# Patient Record
Sex: Male | Born: 1985 | Race: Black or African American | Marital: Single
Health system: Southern US, Community
[De-identification: ages and names within clinical notes are randomized; demographics above are authoritative.]

---

## 2006-11-25 ENCOUNTER — Emergency Department: Payer: Self-pay | Admitting: Emergency Medicine

## 2008-05-31 ENCOUNTER — Ambulatory Visit: Payer: Self-pay

## 2009-01-11 ENCOUNTER — Emergency Department: Payer: Self-pay | Admitting: Emergency Medicine

## 2010-02-15 IMAGING — CR DG CHEST 2V
1 series · 2 of 2 positions shown · non-contrast
Comparison: none

REASON FOR EXAM: +PPD, FAX DR> LATA FAX [phone]
COMMENTS:

[Series 1: view not recorded · 0.17mm/px · 2 of 2 slices shown]
[im 1/2]
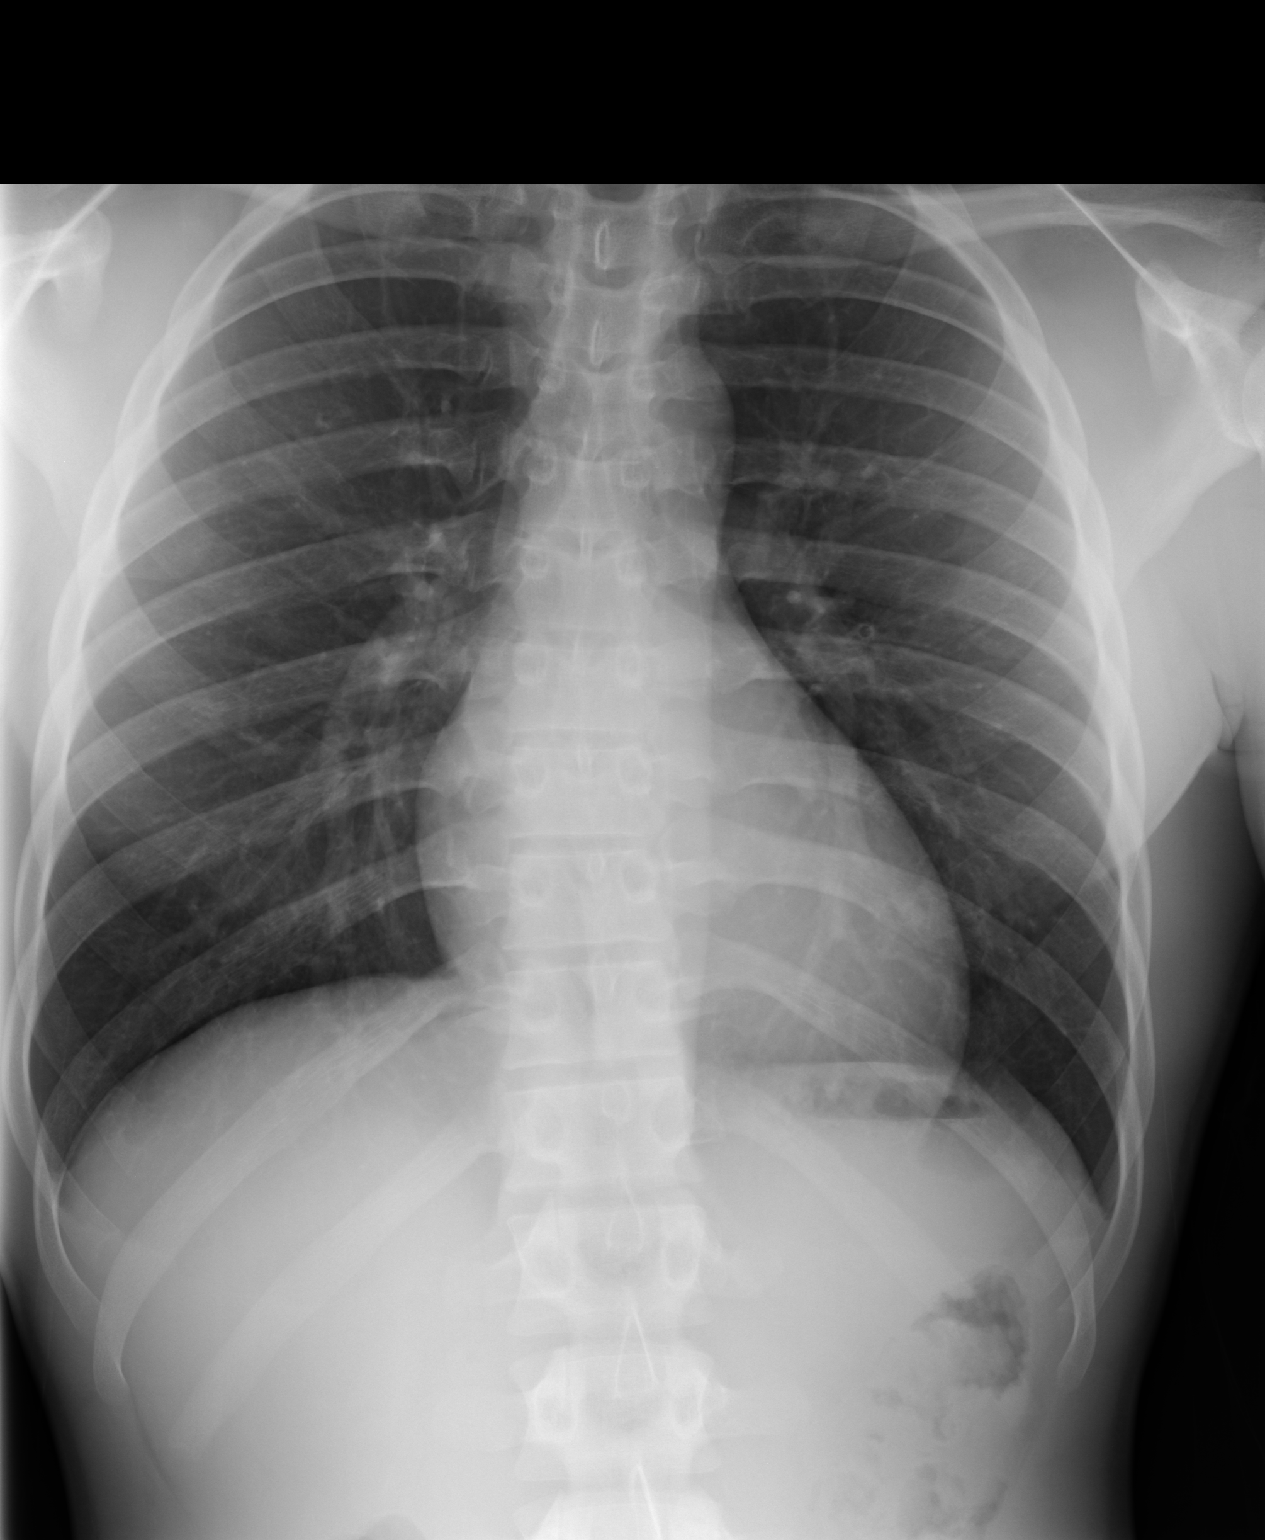
[im 2/2]
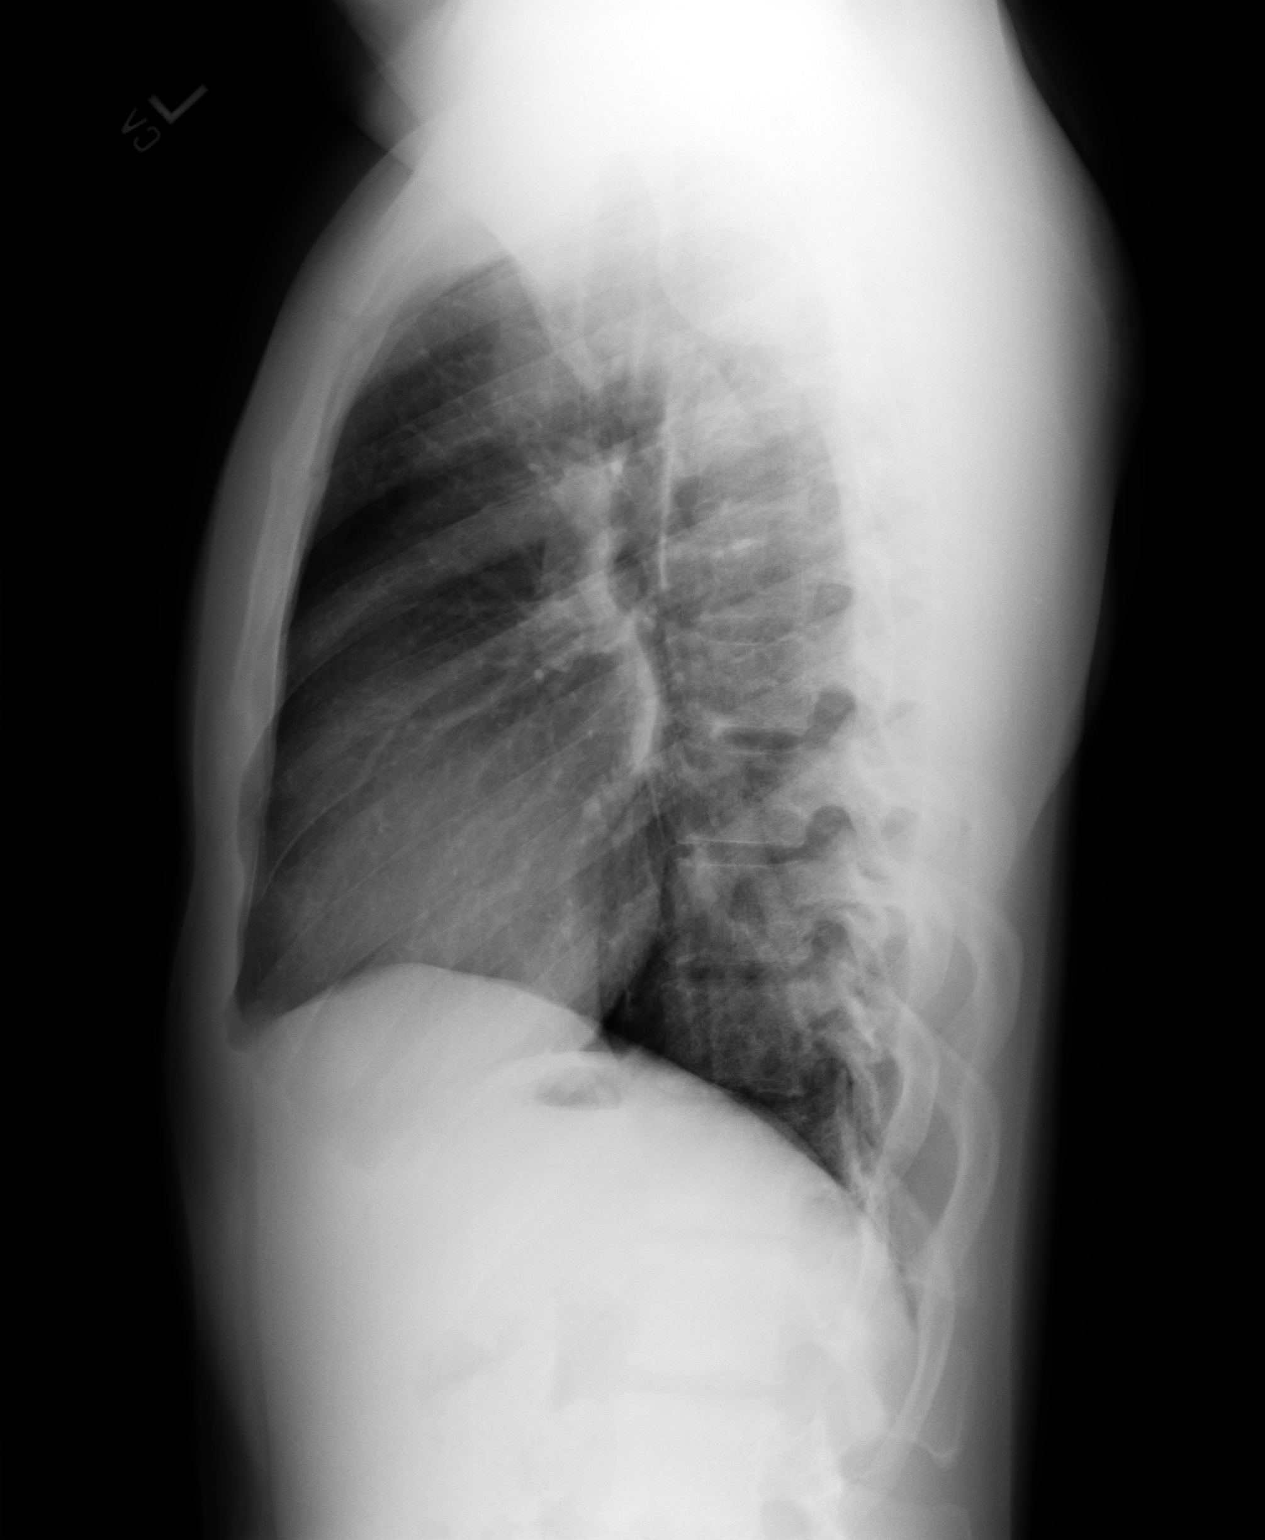

[2 of 2 positions shown; findings below may reference images not displayed]

PROCEDURE:     DXR - DXR CHEST PA (OR AP) AND LATERAL  - May 31, 2008 [DATE]

RESULT:     PA and lateral views of the chest were obtained. The lung fields
are clear. No pneumonia, pneumothorax or pleural effusion is seen.
Attention to the lung apices shows no infiltrate or cystic change suspicious
for tuberculosis. Heart size is normal. The mediastinal and osseous
structures show no significant abnormalities.
IMPRESSION: 1.     No significant abnormalities are noted.

## 2010-04-24 ENCOUNTER — Emergency Department: Payer: Self-pay | Admitting: Emergency Medicine

## 2012-04-15 ENCOUNTER — Emergency Department: Payer: Self-pay | Admitting: Emergency Medicine

## 2012-04-15 LAB — ETHANOL
Ethanol %: 0.274 % — ABNORMAL HIGH (ref 0.000–0.080)
Ethanol: 274 mg/dL

## 2015-12-07 ENCOUNTER — Emergency Department: Payer: Self-pay

## 2015-12-07 ENCOUNTER — Emergency Department
Admission: EM | Admit: 2015-12-07 | Discharge: 2015-12-07 | Disposition: A | Discharge status: Home / Self Care | Payer: Self-pay | Attending: Emergency Medicine | Admitting: Emergency Medicine

## 2015-12-07 DIAGNOSIS — F1010 Alcohol abuse, uncomplicated: Secondary | ICD-10-CM | POA: Insufficient documentation

## 2015-12-07 DIAGNOSIS — S7011XA Contusion of right thigh, initial encounter: Secondary | ICD-10-CM | POA: Insufficient documentation

## 2015-12-07 DIAGNOSIS — W2209XA Striking against other stationary object, initial encounter: Secondary | ICD-10-CM | POA: Insufficient documentation

## 2015-12-07 DIAGNOSIS — Y9389 Activity, other specified: Secondary | ICD-10-CM | POA: Insufficient documentation

## 2015-12-07 DIAGNOSIS — Z23 Encounter for immunization: Secondary | ICD-10-CM | POA: Insufficient documentation

## 2015-12-07 DIAGNOSIS — Y998 Other external cause status: Secondary | ICD-10-CM | POA: Insufficient documentation

## 2015-12-07 DIAGNOSIS — S0003XA Contusion of scalp, initial encounter: Secondary | ICD-10-CM

## 2015-12-07 DIAGNOSIS — S0101XA Laceration without foreign body of scalp, initial encounter: Secondary | ICD-10-CM | POA: Insufficient documentation

## 2015-12-07 DIAGNOSIS — Y9289 Other specified places as the place of occurrence of the external cause: Secondary | ICD-10-CM | POA: Insufficient documentation

## 2015-12-07 MED ORDER — TETANUS-DIPHTH-ACELL PERTUSSIS 5-2.5-18.5 LF-MCG/0.5 IM SUSP
0.5000 mL | Freq: Once | INTRAMUSCULAR | Status: AC
  Administered 2015-12-07: 0.5 mL via INTRAMUSCULAR
  Filled 2015-12-07: qty 0.5

## 2015-12-07 MED ORDER — LIDOCAINE HCL (PF) 1 % IJ SOLN
5.0000 mL | Freq: Once | INTRAMUSCULAR | Status: DC
  Filled 2015-12-07: qty 5

## 2015-12-07 NOTE — Discharge Instructions (Signed)
May take Tylenol as needed for pain. Do not drink any alcohol the remainder of the day. Clean area daily as directed below. Return to the emergency room if any signs of infection.   WOUND CARE Please return in 7 days to have your stitches/staples removed or sooner if you have concerns.  Keep area clean and dry for 24 hours. Do not remove bandage, if applied.  After 24 hours, remove bandage and wash wound gently with mild soap and warm water. Reapply a new bandage after cleaning wound, if directed.  Continue daily cleansing with soap and water until stitches/staples are removed.  Do not apply any ointments or creams to the wound while stitches/staples are in place, as this may cause delayed healing.  Notify the office if you experience any of the following signs of infection: Swelling, redness, pus drainage, streaking, fever >101.0 F  Notify the office if you experience excessive bleeding that does not stop after 15-20 minutes of constant, firm pressure.

## 2015-12-07 NOTE — ED Notes (Signed)
Pt reports he was chasing his dog when he fell and hit his head on the step. Pt has approx 1 inch lac to the left side of his head with bleeding controlled at this time. Pt also co pain to the muscle in the left thigh on the back.

## 2015-12-07 NOTE — ED Provider Notes (Signed)
[facility] Emergency Department Provider Note  ____________________________________________  Time seen: Approximately 7:48 AM  I have reviewed the triage vital signs and the nursing notes.   HISTORY  Chief Complaint Head Laceration  HPI Jack Sanchez is a 30 y.o. male is here with laceration to the left side of his head. Patient states he was chasing his dog when he felt his head on a step. He denies any loss of consciousness during this injury. Patient also complains of left thigh pain. This accident occurred at approximately 3 AM. Patient admits to drinking approximately 6-7 beers this morning during an prior to his fall from chasing his dog. He denies any dizziness, blurred vision, or headache.Currently he rates his pain as a 7 out of 10. He is uncertain when last time he had a tetanus booster.   No past medical history on file.  There are no active problems to display for this patient.   No past surgical history on file.  No current outpatient prescriptions on file.  Allergies Review of patient's allergies indicates no known allergies.  No family history on file.  Social History Social History  Substance Use Topics  . Smoking status: Not on file  . Smokeless tobacco: Not on file  . Alcohol Use: Not on file    Review of Systems Constitutional: No fever/chills Eyes: Some blurred vision. ENT: No trauma Cardiovascular: Denies chest pain. Respiratory: Denies shortness of breath. Gastrointestinal: No abdominal pain.  No nausea, no vomiting.   Musculoskeletal: Negative for back pain. Skin: Positive for laceration Neurological: Negative for headaches, focal weakness or numbness.  10-point ROS otherwise negative.  ____________________________________________   PHYSICAL EXAM:  VITAL SIGNS: ED Triage Vitals  Enc Vitals Group     BP 12/07/15 0614 143/74 mmHg     Pulse Rate 12/07/15 0614 111     Resp 12/07/15 0614 18     Temp 12/07/15  0614 98.7 F (37.1 C)     Temp Source 12/07/15 0614 Oral     SpO2 12/07/15 0614 95 %     Weight 12/07/15 0614 200 lb (90.719 kg)     Height 12/07/15 0614 6\' 2"  (1.88 m)     Head Cir --      Peak Flow --      Pain Score 12/07/15 0615 7     Pain Loc --      Pain Edu? --      Excl. in GC? --     Constitutional: Alert and oriented. Well appearing and in no acute distress. Strong odor of EtOH present. Eyes: Conjunctivae are normal. PERRL. EOMI. Head: Atraumatic. Nose: No congestion/rhinnorhea. Neck: No stridor. No cervical spine tenderness on palpation posteriorly in range of motion is within normal limits without any difficulty. Cardiovascular: Normal rate, regular rhythm. Grossly normal heart sounds.  Good peripheral circulation. Respiratory: Normal respiratory effort.  No retractions. Lungs CTAB. Gastrointestinal: Soft and nontender. No distention.  Musculoskeletal: Left femur no visible signs of deformity or soft tissue edema present. No ecchymosis is noted and no abrasions were seen. Neurologic:  Normal speech and language. No gross focal neurologic deficits are appreciated. No gait instability. Skin:  Skin is warm, dry and intact. Left lateral scalp there is a irregular 2.5 cm laceration without active bleeding. There is no foreign body noted. Psychiatric: Mood and affect are normal. Speech and behavior are normal.  ____________________________________________   LABS (all labs ordered are listed, but only abnormal results are displayed)  Labs Reviewed -  No data to display  RADIOLOGY  Right femur no fracture or dislocation per radiologist. CT scan per radiologist shows no skull fracture, no masses are intracranial hemorrhage. ____________________________________________   PROCEDURES  Procedure(s) performed: LACERATION REPAIR Performed by: Tommi Rumpshonda L Summers Authorized by: Tommi Rumpshonda L Summers Consent: Verbal consent obtained. Risks and benefits: risks, benefits and  alternatives were discussed Consent given by: patient Patient identity confirmed: provided demographic data Prepped and Draped in normal sterile fashion Wound explored  Laceration Location: left lateral scalp   Laceration Length: 2.5 cm  No Foreign Bodies seen or palpated  Anesthesia: local infiltration  Local anesthetic: lidocaine 1% without epinephrine  Anesthetic total: 2.0 ml  Irrigation method: syringe Amount of cleaning: standard  Skin closure: Staples  Number of sutures: 4  Technique: individual  Patient tolerance: Patient tolerated the procedure well with no immediate complications.  Critical Care performed: No  ____________________________________________   INITIAL IMPRESSION / ASSESSMENT AND PLAN / ED COURSE  Pertinent labs & imaging results that were available during my care of the patient were reviewed by me and considered in my medical decision making (see chart for details).  Patient is to return to the emergency room for staple removal. He is given information on keeping clean and dry and to watch for signs of infection. He is taken over the counter Tylenol if needed for pain. ____________________________________________   FINAL CLINICAL IMPRESSION(S) / ED DIAGNOSES  Final diagnoses:  Laceration of scalp without foreign body, initial encounter  Contusion of scalp, initial encounter  Contusion of right thigh, initial encounter      Tommi RumpsRhonda L Summers, PA-C 12/07/15 1620  Emily FilbertJonathan E Williams, MD 12/08/15 [phone]

## 2015-12-16 ENCOUNTER — Emergency Department
Admission: EM | Admit: 2015-12-16 | Discharge: 2015-12-16 | Disposition: A | Discharge status: Home / Self Care | Payer: Self-pay | Attending: Emergency Medicine | Admitting: Emergency Medicine

## 2015-12-16 ENCOUNTER — Encounter: Payer: Self-pay | Admitting: Emergency Medicine

## 2015-12-16 DIAGNOSIS — Z4801 Encounter for change or removal of surgical wound dressing: Secondary | ICD-10-CM | POA: Insufficient documentation

## 2015-12-16 DIAGNOSIS — Z4802 Encounter for removal of sutures: Secondary | ICD-10-CM

## 2015-12-16 NOTE — ED Provider Notes (Signed)
[facility] Emergency Department Provider Note   ____________________________________________  Time seen: 2:24 PM  I have reviewed the triage vital signs and the nursing notes.   HISTORY  Chief Complaint Suture / Staple Removal      HPI Jack Sanchez is a 30 y.o. male who presents to the emergency department for staple removal.Staples were inserted to here on 12/07/2015. He denies complaints.   History reviewed. No pertinent past medical history.  There are no active problems to display for this patient.   History reviewed. No pertinent past surgical history.  No current outpatient prescriptions on file.  Allergies Review of patient's allergies indicates no known allergies.  No family history on file.  Social History Social History  Substance Use Topics  . Smoking status: None  . Smokeless tobacco: None  . Alcohol Use: None    Review of Systems  Constitutional: Denies fever.  HEENT: No change from baseline Respiratory: No cough or shortness of breath Musculoskeletal: No pain. Skin: healing wound; pain gradually resolving.  ____________________________________________   PHYSICAL EXAM:  VITAL SIGNS: ED Triage Vitals  Enc Vitals Group     BP 12/16/15 1425 126/73 mmHg     Pulse Rate 12/16/15 1425 89     Resp 12/16/15 1425 18     Temp 12/16/15 1425 98.3 F (36.8 C)     Temp src --      SpO2 12/16/15 1425 97 %     Weight --      Height --      Head Cir --      Peak Flow --      Pain Score --      Pain Loc --      Pain Edu? --      Excl. in GC? --       Constitutional: Appears well. No distress HEENT: Atraumtaic, normal appearance, EOMI, sclera normal, voice normal. Respiratory: Respirations even and unlabored.  Cardiovascular: Capillary refill normal. Peripheral pulses 2+ Musculoskeletal: Full ROM x 4. Skin: Well-healed laceration with 4 staples to the left parietal area. No evidence of infection or  cellulitis. Neurovascular: Gait steady; Alert and oriented x 4.   PROCEDURES  Procedure(s) performed: SUTURE REMOVAL Performed by: RN  Consent: Verbal consent obtained. Patient identity confirmed: provided demographic data Time out: Immediately prior to procedure a "time out" was called to verify the correct patient, procedure, equipment, support staff and site/side marked as required.  Location details: Parietal area  Wound Appearance: clean  Sutures/Staples Removed: 4  Facility: sutures placed in this facility Patient tolerance: Patient tolerated the procedure well with no immediate complications.    ____________________________________________   INITIAL IMPRESSION / ASSESSMENT AND PLAN / ED COURSE  Pertinent labs & imaging results that were available during my care of the patient were reviewed by me and considered in my medical decision making (see chart for details).  Wound care discussed. Patient advised to keep covered with sunscreen. Patient was advised to return to the ER for symptoms that change or worsen if unable to schedule an appointment with primary care.  ____________________________________________   FINAL CLINICAL IMPRESSION(S) / ED DIAGNOSES  Final diagnoses:  Encounter for staple removal      Chinita Pester, FNP 12/16/15 1530  Sharman Cheek, MD 12/17/15 2118

## 2015-12-16 NOTE — ED Notes (Signed)
4 staples removed from left side head without difficulty. No bleeding noted.

## 2015-12-16 NOTE — Discharge Instructions (Signed)

## 2015-12-16 NOTE — ED Notes (Signed)
Pt presents to have staples removed. Four staples intact to left side of head. No signs/symptoms of infection.

## 2018-07-29 ENCOUNTER — Emergency Department
Admission: EM | Admit: 2018-07-29 | Discharge: 2018-07-29 | Disposition: A | Discharge status: Home / Self Care | Payer: Self-pay | Attending: Emergency Medicine | Admitting: Emergency Medicine

## 2018-07-29 ENCOUNTER — Emergency Department: Payer: Self-pay

## 2018-07-29 ENCOUNTER — Other Ambulatory Visit: Payer: Self-pay

## 2018-07-29 DIAGNOSIS — G540 Brachial plexus disorders: Secondary | ICD-10-CM | POA: Insufficient documentation

## 2018-07-29 DIAGNOSIS — F17200 Nicotine dependence, unspecified, uncomplicated: Secondary | ICD-10-CM | POA: Insufficient documentation

## 2018-07-29 DIAGNOSIS — M25512 Pain in left shoulder: Secondary | ICD-10-CM | POA: Insufficient documentation

## 2018-07-29 MED ORDER — IBUPROFEN 600 MG PO TABS: 600.0000 mg | ORAL_TABLET | Freq: Three times a day (TID) | ORAL | 0 refills | Status: DC | PRN

## 2018-07-29 MED ORDER — IBUPROFEN 600 MG PO TABS
600.0000 mg | ORAL_TABLET | Freq: Once | ORAL | Status: AC
  Administered 2018-07-29: 600 mg via ORAL
  Filled 2018-07-29: qty 1

## 2018-07-29 NOTE — Discharge Instructions (Addendum)
Please take your pain medication as prescribed 3 times a day and most critically make an appointment to establish care with a primary care physician.  It is possible that you have thoracic outlet syndrome which needs to be fully worked up and evaluated.  Return to the emergency department for any concerns.  It was a pleasure to take care of you today, and thank you for coming to our emergency department.  If you have any questions or concerns before leaving please ask the nurse to grab me and I'm more than happy to go through your aftercare instructions again.  If you were prescribed any opioid pain medication today such as Norco, Vicodin, Percocet, morphine, hydrocodone, or oxycodone please make sure you do not drive when you are taking this medication as it can alter your ability to drive safely.  If you have any concerns once you are home that you are not improving or are in fact getting worse before you can make it to your follow-up appointment, please do not hesitate to call 911 and come back for further evaluation.  Merrily Brittle, MD

## 2018-07-29 NOTE — ED Provider Notes (Signed)
[facility] Emergency Department Provider Note  ____________________________________________   First MD Initiated Contact with Patient 07/29/18 0407     (approximate)  I have reviewed the triage vital signs and the nursing notes.   HISTORY  Chief Complaint Arm Pain   HPI Jack Sanchez is a 32 y.o. male comes to the emergency department with 2 to 3 weeks of atraumatic left neck and left shoulder pain.  The patient's wife is currently at labor and delivery and scheduled to deliver a baby today or tomorrow and the patient noted left shoulder pain and nursing upstairs advised him to come to the emergency department for evaluation.  He is right-hand dominant.  He does report numbness and tingling in his left hand particularly when raising his arm above his head.  He is a cigarette smoker.  He has had increasing cough recently.  No weakness in his arm.  No change in color in his arm.    History reviewed. No pertinent past medical history.  There are no active problems to display for this patient.   History reviewed. No pertinent surgical history.  Prior to Admission medications   Medication Sig Start Date End Date Taking? Authorizing Provider  ibuprofen (ADVIL,MOTRIN) 600 MG tablet Take 1 tablet (600 mg total) by mouth every 8 (eight) hours as needed. 07/29/18   Merrily Brittle, MD    Allergies Patient has no known allergies.  No family history on file.  Social History Social History   Tobacco Use  . Smoking status: Current Every Day Smoker  . Smokeless tobacco: Never Used  Substance Use Topics  . Alcohol use: Not on file  . Drug use: Not on file    Review of Systems Constitutional: No fever/chills Cardiovascular: Denies chest pain. Respiratory: Positive for cough Gastrointestinal: No abdominal pain.  No nausea, no vomiting.  Musculoskeletal: Positive for shoulder and arm pain Neurological: Positive for left arm  numbness   ____________________________________________   PHYSICAL EXAM:  VITAL SIGNS: ED Triage Vitals  Enc Vitals Group     BP 07/29/18 0352 (!) 143/87     Pulse Rate 07/29/18 0352 79     Resp 07/29/18 0352 18     Temp 07/29/18 0352 98.4 F (36.9 C)     Temp Source 07/29/18 0352 Oral     SpO2 07/29/18 0352 97 %     Weight 07/29/18 0350 240 lb (108.9 kg)     Height 07/29/18 0350 6\' 2"  (1.88 m)     Head Circumference --      Peak Flow --      Pain Score 07/29/18 0350 7     Pain Loc --      Pain Edu? --      Excl. in GC? --     Constitutional: Alert and oriented x4 pleasant cooperative speaks in full clear sentences no diaphoresis Head: Atraumatic. Nose: No congestion/rhinnorhea. Mouth/Throat: No trismus Neck: No stridor.   Cardiovascular: Regular rate and rhythm Respiratory: Normal respiratory effort.  No retractions.  Clear to auscultation bilaterally MSK: Focally tender over left lateral neck and left trapezius.  Numbness and discomfort when elevating the arm beyond 90 degrees.  Otherwise normal motor exam less than 2-second capillary refill Neurologic:  Normal speech and language. No gross focal neurologic deficits are appreciated.  Skin:  Skin is warm, dry and intact. No rash noted.    ____________________________________________  LABS (all labs ordered are listed, but only abnormal results are displayed)  Labs Reviewed -  No data to display   __________________________________________  EKG   ____________________________________________  RADIOLOGY  X-ray of the chest reviewed by me with no acute disease X-ray of the shoulder reviewed by me with no acute disease ____________________________________________   DIFFERENTIAL includes but not limited to  Bursitis, shoulder dislocation, fracture, thoracic outlet syndrome   PROCEDURES  Procedure(s) performed: no  Procedures  Critical Care performed:  no  ____________________________________________   INITIAL IMPRESSION / ASSESSMENT AND PLAN / ED COURSE  Pertinent labs & imaging results that were available during my care of the patient were reviewed by me and considered in my medical decision making (see chart for details).   As part of my medical decision making, I reviewed the following data within the electronic MEDICAL RECORD NUMBER History obtained from family if available, nursing notes, old chart and ekg, as well as notes from prior ED visits.  The patient comes the emergency department with positional numbness and tingling in his arm.  He is a smoker so I obtained chest and shoulder x-rays looking for a Pancoast tumor etc.  Imaging is negative and he feels improved after ibuprofen.  He is neurovascularly intact at rest however when he raises his left arm above his head he has persistent numbness and tingling raising concern for thoracic outlet syndrome.  He does not have a primary care physician but I will help him establish care.  Strict return precautions have been given.      ____________________________________________   FINAL CLINICAL IMPRESSION(S) / ED DIAGNOSES  Final diagnoses:  Acute pain of left shoulder  Thoracic outlet syndrome      NEW MEDICATIONS STARTED DURING THIS VISIT:  There are no discharge medications for this patient.    Note:  This document was prepared using Dragon voice recognition software and may include unintentional dictation errors.      Merrily Brittle, MD 07/29/18 [phone]

## 2018-07-29 NOTE — ED Triage Notes (Signed)
Pt arrives to ED from L&D with c/o left arm pain and tingling x2 weeks. Pt states he thinks he "might have pulled a muscle or something". Pt denies any c/o CP, no SHOB. No fevers, no n/v/d.

## 2020-06-10 ENCOUNTER — Other Ambulatory Visit: Payer: Self-pay

## 2020-06-10 ENCOUNTER — Ambulatory Visit: Payer: Medicaid Other | Admitting: Physician Assistant

## 2020-06-10 DIAGNOSIS — Z113 Encounter for screening for infections with a predominantly sexual mode of transmission: Secondary | ICD-10-CM

## 2020-06-10 LAB — GRAM STAIN

## 2020-06-10 NOTE — Progress Notes (Signed)
Gram stain reviewed and is negative today, so no treatment needed for gram stain per standing order. Awaiting TR's. Provider orders completed.

## 2020-06-12 ENCOUNTER — Encounter: Payer: Self-pay | Admitting: Physician Assistant

## 2020-06-12 ENCOUNTER — Other Ambulatory Visit: Payer: Self-pay

## 2020-06-12 ENCOUNTER — Ambulatory Visit: Payer: Medicaid Other

## 2020-06-12 VITALS — Wt 240.0 lb

## 2020-06-12 DIAGNOSIS — Z202 Contact with and (suspected) exposure to infections with a predominantly sexual mode of transmission: Secondary | ICD-10-CM | POA: Diagnosis not present

## 2020-06-12 MED ORDER — CEFTRIAXONE SODIUM 500 MG IJ SOLR
500.0000 mg | Freq: Once | INTRAMUSCULAR | Status: AC
  Administered 2020-06-12: 500 mg via INTRAMUSCULAR

## 2020-06-12 NOTE — Progress Notes (Signed)
Here today for tx as Contact to Gonorrhea. Tx per SO with Ceftriaxone 500 mg IM Right Deltoid. Pt tolerated well. Observed for 20 min after injection without problem. Jerel Shepherd, RN

## 2020-06-12 NOTE — Progress Notes (Signed)
   [facility] Department STI clinic/screening visit  Subjective:  Jack Sanchez is a 34 y.o. male being seen today for an STI screening visit. The patient reports they do not have symptoms.    Patient has the following medical conditions:  There are no problems to display for this patient.    Chief Complaint  Patient presents with  . SEXUALLY TRANSMITTED DISEASE    screening    HPI  Patient reports that he is not having any symptoms but a partner of his is getting screened and he decided to also get screening today.  Denies chronic conditions, surgeries and regular medicines.  Reports last HIV testing was done in 2016.   See flowsheet for further details and programmatic requirements.    The following portions of the patient's history were reviewed and updated as appropriate: allergies, current medications, past medical history, past social history, past surgical history and problem list.  Objective:  There were no vitals filed for this visit.  Physical Exam Constitutional:      General: He is not in acute distress.    Appearance: Normal appearance.  HENT:     Head: Normocephalic and atraumatic.     Comments: No nits, lice or hair loss. No cervical, supraclavicular or axillary adenopathy.    Mouth/Throat:     Mouth: Mucous membranes are moist.     Pharynx: Oropharynx is clear. No oropharyngeal exudate or posterior oropharyngeal erythema.  Eyes:     Conjunctiva/sclera: Conjunctivae normal.  Pulmonary:     Effort: Pulmonary effort is normal.  Abdominal:     Palpations: Abdomen is soft. There is no mass.     Tenderness: There is no abdominal tenderness. There is no guarding or rebound.  Genitourinary:    Penis: Normal.      Testes: Normal.     Comments: Pubic area without nits, lice, edema, erythema, lesions and inguinal adenopathy. Penis circumcised without rash, lesion and discharge at meatus. Musculoskeletal:     Cervical back: Neck supple. No  tenderness.  Skin:    General: Skin is warm and dry.     Findings: No bruising, erythema, lesion or rash.  Neurological:     Mental Status: He is alert and oriented to person, place, and time.  Psychiatric:        Mood and Affect: Mood normal.        Behavior: Behavior normal.        Thought Content: Thought content normal.        Judgment: Judgment normal.       Assessment and Plan:  Jack Sanchez is a 34 y.o. male presenting to the [facility] Department for STI screening  1. Screening for STD (sexually transmitted disease) Patient into clinic without symptoms. Counseled patient to call back for a treatment appointment if a partner tests positive for anything before his results are back. Rec condoms with all sex. Await test results.  Counseled that RN will call if needs to RTC for treatment once results are back. - Gram stain - Gonococcus culture - HIV [facility] - Syphilis Serology, [facility] - Gonococcus culture     Return for 2-3 weeks for TR's, and PRN.  No future appointments.  Matt Holmes, PA

## 2020-06-14 LAB — GONOCOCCUS CULTURE

## 2020-06-19 ENCOUNTER — Telehealth: Payer: Self-pay | Admitting: Family Medicine

## 2020-06-19 NOTE — Telephone Encounter (Signed)
After obtaining correct last 4 numbers of Social Security Number and password, client provided requested test results. Jossie Ng, RN

## 2020-06-19 NOTE — Telephone Encounter (Signed)
Patient is wanting test results.

## 2022-08-03 ENCOUNTER — Emergency Department: Payer: Medicaid Other

## 2022-08-03 ENCOUNTER — Other Ambulatory Visit: Payer: Self-pay

## 2022-08-03 ENCOUNTER — Emergency Department
Admission: EM | Admit: 2022-08-03 | Discharge: 2022-08-04 | Disposition: A | Discharge status: Home / Self Care | Payer: Medicaid Other | Attending: Emergency Medicine | Admitting: Emergency Medicine

## 2022-08-03 DIAGNOSIS — R1013 Epigastric pain: Secondary | ICD-10-CM | POA: Diagnosis present

## 2022-08-03 DIAGNOSIS — K7010 Alcoholic hepatitis without ascites: Secondary | ICD-10-CM | POA: Insufficient documentation

## 2022-08-03 LAB — CBC
HCT: 36.9 % — ABNORMAL LOW (ref 39.0–52.0)
Hemoglobin: 12.4 g/dL — ABNORMAL LOW (ref 13.0–17.0)
MCH: 28.4 pg (ref 26.0–34.0)
MCHC: 33.6 g/dL (ref 30.0–36.0)
MCV: 84.4 fL (ref 80.0–100.0)
Platelets: 400 10*3/uL (ref 150–400)
RBC: 4.37 MIL/uL (ref 4.22–5.81)
RDW: 13.2 % (ref 11.5–15.5)
WBC: 15.1 10*3/uL — ABNORMAL HIGH (ref 4.0–10.5)
nRBC: 0 % (ref 0.0–0.2)

## 2022-08-03 LAB — COMPREHENSIVE METABOLIC PANEL
ALT: 156 U/L — ABNORMAL HIGH (ref 0–44)
AST: 194 U/L — ABNORMAL HIGH (ref 15–41)
Albumin: 3.6 g/dL (ref 3.5–5.0)
Alkaline Phosphatase: 463 U/L — ABNORMAL HIGH (ref 38–126)
Anion gap: 7 (ref 5–15)
BUN: 7 mg/dL (ref 6–20)
CO2: 29 mmol/L (ref 22–32)
Calcium: 9.5 mg/dL (ref 8.9–10.3)
Chloride: 99 mmol/L (ref 98–111)
Creatinine, Ser: 0.93 mg/dL (ref 0.61–1.24)
GFR, Estimated: >60 mL/min (ref >60)
Glucose, Bld: 142 mg/dL — ABNORMAL HIGH (ref 70–99)
Potassium: 3.6 mmol/L (ref 3.5–5.1)
Sodium: 135 mmol/L (ref 135–145)
Total Bilirubin: 1.7 mg/dL — ABNORMAL HIGH (ref 0.3–1.2)
Total Protein: 8.9 g/dL — ABNORMAL HIGH (ref 6.5–8.1)

## 2022-08-03 LAB — URINALYSIS, ROUTINE W REFLEX MICROSCOPIC
Bilirubin Urine: NEGATIVE
Glucose, UA: NEGATIVE mg/dL
Hgb urine dipstick: NEGATIVE
Ketones, ur: NEGATIVE mg/dL
Nitrite: NEGATIVE
Protein, ur: NEGATIVE mg/dL
Specific Gravity, Urine: 1.017 (ref 1.005–1.030)
pH: 7 (ref 5.0–8.0)

## 2022-08-03 LAB — HEPATIC FUNCTION PANEL
ALT: 155 U/L — ABNORMAL HIGH (ref 0–44)
AST: 194 U/L — ABNORMAL HIGH (ref 15–41)
Albumin: 3.5 g/dL (ref 3.5–5.0)
Alkaline Phosphatase: 457 U/L — ABNORMAL HIGH (ref 38–126)
Bilirubin, Direct: 1 mg/dL — ABNORMAL HIGH (ref 0.0–0.2)
Indirect Bilirubin: 0.8 mg/dL (ref 0.3–0.9)
Total Bilirubin: 1.8 mg/dL — ABNORMAL HIGH (ref 0.3–1.2)
Total Protein: 8.5 g/dL — ABNORMAL HIGH (ref 6.5–8.1)

## 2022-08-03 LAB — TROPONIN I (HIGH SENSITIVITY): Troponin I (High Sensitivity): 8 ng/L (ref <18)

## 2022-08-03 LAB — PROTIME-INR
INR: 1.1 (ref 0.8–1.2)
Prothrombin Time: 14 seconds (ref 11.4–15.2)

## 2022-08-03 LAB — LIPASE, BLOOD: Lipase: 28 U/L (ref 11–51)

## 2022-08-03 LAB — CK: Total CK: 200 U/L (ref 49–397)

## 2022-08-03 MED ORDER — SUCRALFATE 1 G PO TABS: 1.0000 g | ORAL_TABLET | Freq: Three times a day (TID) | ORAL | 0 refills | Status: DC

## 2022-08-03 MED ORDER — PANTOPRAZOLE SODIUM 40 MG PO TBEC: 40.0000 mg | DELAYED_RELEASE_TABLET | Freq: Every day | ORAL | 0 refills | Status: DC

## 2022-08-03 MED ORDER — ALUM & MAG HYDROXIDE-SIMETH 200-200-20 MG/5ML PO SUSP
30.0000 mL | Freq: Once | ORAL | Status: AC
  Administered 2022-08-03: 30 mL via ORAL
  Filled 2022-08-03: qty 30

## 2022-08-03 MED ORDER — DROPERIDOL 2.5 MG/ML IJ SOLN
2.5000 mg | Freq: Once | INTRAMUSCULAR | Status: AC
  Administered 2022-08-03: 2.5 mg via INTRAVENOUS
  Filled 2022-08-03: qty 2

## 2022-08-03 MED ORDER — PANTOPRAZOLE SODIUM 40 MG IV SOLR
40.0000 mg | Freq: Once | INTRAVENOUS | Status: AC
  Administered 2022-08-03: 40 mg via INTRAVENOUS
  Filled 2022-08-03: qty 10

## 2022-08-03 MED ORDER — FENTANYL CITRATE PF 50 MCG/ML IJ SOSY
50.0000 ug | PREFILLED_SYRINGE | Freq: Once | INTRAMUSCULAR | Status: AC
  Administered 2022-08-03: 50 ug via INTRAVENOUS
  Filled 2022-08-03: qty 1

## 2022-08-03 MED ORDER — IOHEXOL 300 MG/ML  SOLN
100.0000 mL | Freq: Once | INTRAMUSCULAR | Status: AC | PRN
  Administered 2022-08-03: 100 mL via INTRAVENOUS

## 2022-08-03 MED ORDER — ONDANSETRON HCL 4 MG/2ML IJ SOLN
4.0000 mg | Freq: Once | INTRAMUSCULAR | Status: AC
  Administered 2022-08-03: 4 mg via INTRAVENOUS
  Filled 2022-08-03: qty 2

## 2022-08-03 NOTE — ED Provider Notes (Addendum)
[facility] Provider Note    Event Date/Time   First MD Initiated Contact with Patient 08/03/22 1801     (approximate)   History   Abdominal Pain   HPI  Jack Sanchez is a 36 y.o. male who comes in with epigastric pain.  Patient initially told triage that he had had some nausea vomiting but he reports that this was over a week ago that his has not had any recently and is more of just the pain that brought him in today.  Patient reports having upper abdominal discomfort for the past couple days states that he feels like he could just pulled something though in his back.  Denies any marijuana use but does report drinking alcohol daily.  Does report using cocaine but denies any IV drug use.  Denies any significant chest pain, shortness of breath lower abdominal pain.  Denies any dysuria pain radiating to his testicles or hematuria. Denies any confusion or sleepiness.   Physical Exam   Triage Vital Signs: ED Triage Vitals  Enc Vitals Group     BP 08/03/22 1748 (!) 144/98     Pulse Rate 08/03/22 1748 70     Resp 08/03/22 1748 15     Temp 08/03/22 1748 98.2 F (36.8 C)     Temp Source 08/03/22 1748 Oral     SpO2 08/03/22 1748 98 %     Weight 08/03/22 1749 245 lb (111.1 kg)     Height --      Head Circumference --      Peak Flow --      Pain Score 08/03/22 1749 8     Pain Loc --      Pain Edu? --      Excl. in GC? --     Most recent vital signs: Vitals:   08/03/22 1748  BP: (!) 144/98  Pulse: 70  Resp: 15  Temp: 98.2 F (36.8 C)  SpO2: 98%     General: Awake, no distress.  CV:  Good peripheral perfusion.  Resp:  Normal effort.  Abd:  No distention.  Patient is some epigastric tenderness noted. Other:     ED Results / Procedures / Treatments   Labs (all labs ordered are listed, but only abnormal results are displayed) Labs Reviewed  LIPASE, BLOOD  COMPREHENSIVE METABOLIC PANEL  CBC  URINALYSIS, ROUTINE W REFLEX MICROSCOPIC      EKG  My interpretation of EKG:  Normal sinus rate of 96 without any ST elevation or T wave inversions, normal intervals  RADIOLOGY I have reviewed the xray personally and interpreted no evidence of free air  PROCEDURES:  Critical Care performed: No  Procedures   MEDICATIONS ORDERED IN ED: Medications  fentaNYL (SUBLIMAZE) injection 50 mcg (50 mcg Intravenous Given 08/03/22 1903)  ondansetron (ZOFRAN) injection 4 mg (4 mg Intravenous Given 08/03/22 1903)  iohexol (OMNIPAQUE) 300 MG/ML solution 100 mL (100 mLs Intravenous Contrast Given 08/03/22 1936)  pantoprazole (PROTONIX) injection 40 mg (40 mg Intravenous Given 08/03/22 2008)  alum & mag hydroxide-simeth (MAALOX/MYLANTA) 200-200-20 MG/5ML suspension 30 mL (30 mLs Oral Given 08/03/22 2009)     IMPRESSION / MDM / ASSESSMENT AND PLAN / ED COURSE  I reviewed the triage vital signs and the nursing notes.   Patient's presentation is most consistent with acute presentation with potential threat to life or bodily function.   Differential includes gallstones, choledocholithiasis, ACS, UTI, perforation etc.  CBC shows elevated white count. Patient's hepatic function  panels is elevated.  No priors to compare to.  His lipase is normal.  He denied any urinary symptoms to suggest UTI no evidence of ACS.  Ultrasound was reassuring and CT imaging was reassuring although does show some evidence of focal fatty infiltration patient denies taking any more than 3 g of Tylenol daily so it seems unlikely to be related to Tylenol overuse.  He denies any IV drug use but hepatitis panel is pending.  He does report alcohol use but states that he only drinks 2 beers daily.  He denies ever having liver test done previously.  After multiple medications patient still feeling uncomfortable we will try some droperidol  INR level is normal.  CK is normal.  Patient denies any urinary symptoms but will send for urine culture.   On repeat assessment  patient feeling much better after droperidol.  Patient is tolerated p.o.  Patient is a little bit sleepy after medication but does report that family can take him home.  Again discussed admission for trending out LFTs versus going home and following up outpatient.  At this time patient is requesting discharge home.  He understands to hold off on Tylenol, ibuprofen and will take Carafate, Protonix and avoid alcohol.  Offered patient Librium taper to help with stopping alcohol and he declined stating that he stopped alcohol in the past and done okay,.  We discussed return precautions in regards to cessation of alcohol and that the dangers of stopping alcohol cold [place].  Patient's girlfriend is at bedside who witnessed this conversation  I offered patient admission for trending out hemoglobins but at this time patient would prefer to go home   FINAL CLINICAL IMPRESSION(S) / ED DIAGNOSES   Final diagnoses:  Alcoholic hepatitis, unspecified whether ascites present     Rx / DC Orders   ED Discharge Orders          Ordered    pantoprazole (PROTONIX) 40 MG tablet  Daily        08/03/22 2346    sucralfate (CARAFATE) 1 g tablet  3 times daily with meals & bedtime        08/03/22 2346             Note:  This document was prepared using Dragon voice recognition software and may include unintentional dictation errors.   Concha Se, MD 08/03/22 Arletha Grippe    Concha Se, MD 08/03/22 [phone]

## 2022-08-03 NOTE — Discharge Instructions (Addendum)
You should avoid Tylenol due to your evidence of liver injury and avoid ibuprofen as this can hurt if there is a gastritis going on.  You should take Protonix to help decrease acid and Carafate to help align the stomach.  You should try to cut down on your alcohol use as this can cause inflammation of the stomach and cause pain.  I offered you a Librium taper to help stop drinking alcohol but you have stated that you do not want this.  It can be dangerous to stop cold [place] so if you decide to stop it you develop withdrawal symptoms such as shaking, tremors you need to come into the ER to be reevaluated.  If you develop worsening pain or fevers you also need to come into the ER to be evaluated.  Otherwise you can call the GI doctor to make follow-up

## 2022-08-03 NOTE — ED Triage Notes (Signed)
Pt arrives with c/o epigastric pain that started about 4 days ago. Pt endorses n/v. Per pt, the pain radiates to his back.

## 2022-08-04 LAB — HEPATITIS PANEL, ACUTE
HCV Ab: NONREACTIVE
Hep A IgM: NONREACTIVE
Hep B C IgM: NONREACTIVE
Hepatitis B Surface Ag: NONREACTIVE

## 2022-08-04 MED ORDER — SUCRALFATE 1 G PO TABS: 1.0000 g | ORAL_TABLET | Freq: Three times a day (TID) | ORAL | 0 refills | Status: AC

## 2022-08-04 MED ORDER — PANTOPRAZOLE SODIUM 40 MG PO TBEC: 40.0000 mg | DELAYED_RELEASE_TABLET | Freq: Every day | ORAL | 0 refills | Status: AC

## 2022-08-04 NOTE — ED Notes (Signed)
E signature pad not working. Pt educated on discharge instructions and verbalized understanding.  

## 2022-08-05 ENCOUNTER — Telehealth: Payer: Self-pay

## 2022-08-05 LAB — URINE CULTURE: Culture: <10000 — AB

## 2022-08-05 NOTE — Telephone Encounter (Signed)
Patient mother called to make patient a appointment because he was seen in the ER yesterday for Alcoholic hepatitis, unspecified whether ascites present. She states she would like [place] to see him today. Informed her we are booked out into January. She states the ER said to follow up with in 2 days. Informed her the ER said to follow up with PCP in 2 days and then follow up in GI. She asked if we could call if we have any cancellations. Informed her no with out making a appointment first. Offered patient 12/10/2021 at 3:15 with Dr. Tobi Bastos. They asked if they could do Friday afternoons or any morning. Informed her the providers do procedures in the mornings and we do not have appointments on Fridays. She states she will call back to make the patient a appointment if they decide to make one

## 2024-02-17 ENCOUNTER — Ambulatory Visit: Payer: Self-pay

## 2024-04-15 ENCOUNTER — Other Ambulatory Visit: Payer: Self-pay

## 2024-04-15 ENCOUNTER — Emergency Department

## 2024-04-15 ENCOUNTER — Emergency Department
Admission: EM | Admit: 2024-04-15 | Discharge: 2024-04-15 | Disposition: A | Discharge status: Home / Self Care | Attending: Emergency Medicine | Admitting: Emergency Medicine

## 2024-04-15 DIAGNOSIS — S161XXA Strain of muscle, fascia and tendon at neck level, initial encounter: Secondary | ICD-10-CM | POA: Insufficient documentation

## 2024-04-15 DIAGNOSIS — Y92410 Unspecified street and highway as the place of occurrence of the external cause: Secondary | ICD-10-CM | POA: Insufficient documentation

## 2024-04-15 DIAGNOSIS — S39012A Strain of muscle, fascia and tendon of lower back, initial encounter: Secondary | ICD-10-CM | POA: Insufficient documentation

## 2024-04-15 DIAGNOSIS — M545 Low back pain, unspecified: Secondary | ICD-10-CM | POA: Diagnosis present

## 2024-04-15 MED ORDER — CYCLOBENZAPRINE HCL 10 MG PO TABS
10.0000 mg | ORAL_TABLET | Freq: Once | ORAL | Status: AC
  Administered 2024-04-15: 10 mg via ORAL
  Filled 2024-04-15: qty 1

## 2024-04-15 MED ORDER — CYCLOBENZAPRINE HCL 10 MG PO TABS: 10.0000 mg | ORAL_TABLET | Freq: Three times a day (TID) | ORAL | 0 refills | Status: AC | PRN

## 2024-04-15 MED ORDER — IBUPROFEN 600 MG PO TABS: 600.0000 mg | ORAL_TABLET | Freq: Four times a day (QID) | ORAL | 0 refills | Status: AC | PRN
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
## 2024-05-08 ENCOUNTER — Ambulatory Visit: Payer: Self-pay

## 2024-05-09 ENCOUNTER — Ambulatory Visit
Admission: RE | Admit: 2024-05-09 | Discharge: 2024-05-09 | Disposition: A | Discharge status: Home / Self Care | Source: Ambulatory Visit | Attending: Emergency Medicine | Admitting: Emergency Medicine

## 2024-05-09 ENCOUNTER — Ambulatory Visit: Payer: Self-pay

## 2024-05-09 ENCOUNTER — Other Ambulatory Visit: Payer: Self-pay

## 2024-05-09 VITALS — BP 118/79 | HR 92 | Temp 98.7°F | Resp 18

## 2024-05-09 DIAGNOSIS — Z202 Contact with and (suspected) exposure to infections with a predominantly sexual mode of transmission: Secondary | ICD-10-CM | POA: Diagnosis present

## 2024-05-09 MED ORDER — METRONIDAZOLE 500 MG PO TABS: 2000.0000 mg | ORAL_TABLET | Freq: Once | ORAL | 0 refills | Status: AC

## 2024-05-09 NOTE — ED Provider Notes (Signed)
 Jack Sanchez    CSN: [id number] Arrival date & time: 05/09/24  1258      History   Chief Complaint Chief Complaint  Patient presents with   SEXUALLY TRANSMITTED DISEASE    Entered by patient    HPI Jack Sanchez is a 38 y.o. male.   Patient presents requesting evaluation and treatment of trichomoniasis due to exposure from partner.  Denies all symptoms.  History reviewed. No pertinent past medical history.  There are no active problems to display for this patient.   History reviewed. No pertinent surgical history.     Home Medications    Prior to Admission medications   Medication Sig Start Date End Date Taking? Authorizing Provider  metroNIDAZOLE (FLAGYL) 500 MG tablet Take 4 tablets (2,000 mg total) by mouth once for 1 dose. 05/09/24 05/09/24 Yes Amilliana Hayworth R, NP  cyclobenzaprine  (FLEXERIL ) 10 MG tablet Take 1 tablet (10 mg total) by mouth 3 (three) times daily as needed. 04/15/24   Fisher, Rufino Coulter, PA-C  ibuprofen  (ADVIL ) 600 MG tablet Take 1 tablet (600 mg total) by mouth every 6 (six) hours as needed. Patient not taking: Reported on 05/09/2024 04/15/24   Delsie Figures, PA-C  pantoprazole  (PROTONIX ) 40 MG tablet Take 1 tablet (40 mg total) by mouth daily for 14 days. 08/04/22 08/18/22  Lynnda Sas, MD  sucralfate  (CARAFATE ) 1 g tablet Take 1 tablet (1 g total) by mouth 4 (four) times daily -  with meals and at bedtime for 14 days. 08/04/22 08/18/22  Lynnda Sas, MD    Family History History reviewed. No pertinent family history.  Social History Social History   Tobacco Use   Smoking status: Every Day   Smokeless tobacco: Never  Vaping Use   Vaping status: Every Day  Substance Use Topics   Alcohol use: Yes   Drug use: Yes    Types: Marijuana     Allergies   Patient has no known allergies.   Review of Systems Review of Systems   Physical Exam Triage Vital Signs ED Triage Vitals  Encounter Vitals Group     BP 05/09/24 1331 118/79      Girls Systolic BP Percentile --      Girls Diastolic BP Percentile --      Boys Systolic BP Percentile --      Boys Diastolic BP Percentile --      Pulse Rate 05/09/24 1331 92     Resp 05/09/24 1331 18     Temp 05/09/24 1331 98.7 F (37.1 C)     Temp Source 05/09/24 1331 Oral     SpO2 05/09/24 1331 99 %     Weight --      Height --      Head Circumference --      Peak Flow --      Pain Score 05/09/24 1329 0     Pain Loc --      Pain Education --      Exclude from Growth Chart --    No data found.  Updated Vital Signs BP 118/79 (BP Location: Right Arm)   Pulse 92   Temp 98.7 F (37.1 C) (Oral)   Resp 18   SpO2 99%   Visual Acuity Right Eye Distance:   Left Eye Distance:   Bilateral Distance:    Right Eye Near:   Left Eye Near:    Bilateral Near:     Physical Exam Constitutional:      Appearance:  Normal appearance.   Eyes:     Extraocular Movements: Extraocular movements intact.   Pulmonary:     Effort: Pulmonary effort is normal.  Genitourinary:    Comments: deferred  Neurological:     Mental Status: He is alert and oriented to person, place, and time. Mental status is at baseline.      UC Treatments / Results  Labs (all labs ordered are listed, but only abnormal results are displayed) Labs Reviewed  CYTOLOGY, (ORAL, ANAL, URETHRAL) ANCILLARY ONLY    EKG   Radiology No results found.  Procedures Procedures (including critical care time)  Medications Ordered in UC Medications - No data to display  Initial Impression / Assessment and Plan / UC Course  I have reviewed the triage vital signs and the nursing notes.  Pertinent labs & imaging results that were available during my care of the patient were reviewed by me and considered in my medical decision making (see chart for details).  Exposure to trichomoniasis  STI panel pending, declined HIV and syphilis testing, treating empirically with metronidazole, discussed administration and  advised abstaining for alcohol for 48 to 72 hours posttreatment and advised abstaining from sexual intercourse for 7 days posttreatment, will will treat additionally per protocol may follow-up with urgent care as needed Final Clinical Impressions(s) / UC Diagnoses   Final diagnoses:  Exposure to trichomonas     Discharge Instructions      You are being treated empirically for possible trichomoniasis  Take all 4 tablets of metronidazole at 1 time then please refrain from having any form of sexual intercourse for the next 7 days, treatment is different in timeframe from women, please refrain from having alcohol for 48 hours after taking medicine as it can make you sick in the stomach  Labs pending 2-3 days, you will be contacted if positive for any sti and treatment will be sent to the pharmacy, you will have to return to the clinic if positive for gonorrhea to receive treatment   Please refrain from having sex until labs results, if positive please refrain from having sex until treatment complete and symptoms resolve   If positive for , Chlamydia  gonorrhea or trichomoniasis please notify partner or partners so they may tested as well  Moving forward, it is recommended you use some form of protection against the transmission of sti infections  such as condoms or dental dams with each sexual encounter     ED Prescriptions     Medication Sig Dispense Auth. Provider   metroNIDAZOLE (FLAGYL) 500 MG tablet Take 4 tablets (2,000 mg total) by mouth once for 1 dose. 4 tablet Meloni Hinz R, NP      PDMP not reviewed this encounter.   Reena Canning, NP 05/09/24 1349

## 2024-05-09 NOTE — Discharge Instructions (Addendum)
 You are being treated empirically for possible trichomoniasis  Take all 4 tablets of metronidazole at 1 time then please refrain from having any form of sexual intercourse for the next 7 days, treatment is different in timeframe from women, please refrain from having alcohol for 48 hours after taking medicine as it can make you sick in the stomach  Labs pending 2-3 days, you will be contacted if positive for any sti and treatment will be sent to the pharmacy, you will have to return to the clinic if positive for gonorrhea to receive treatment   Please refrain from having sex until labs results, if positive please refrain from having sex until treatment complete and symptoms resolve   If positive for , Chlamydia  gonorrhea or trichomoniasis please notify partner or partners so they may tested as well  Moving forward, it is recommended you use some form of protection against the transmission of sti infections  such as condoms or dental dams with each sexual encounter

## 2024-05-09 NOTE — ED Triage Notes (Signed)
 Denies symptoms for std.  Partner tested positive for std-trich

## 2024-05-10 LAB — CYTOLOGY, (ORAL, ANAL, URETHRAL) ANCILLARY ONLY
Chlamydia: NEGATIVE
Comment: NEGATIVE
Comment: NEGATIVE
Comment: NORMAL
Neisseria Gonorrhea: NEGATIVE
Trichomonas: NEGATIVE

## 2024-09-16 ENCOUNTER — Ambulatory Visit (INDEPENDENT_AMBULATORY_CARE_PROVIDER_SITE_OTHER)

## 2024-09-16 ENCOUNTER — Encounter: Payer: Self-pay | Admitting: Emergency Medicine

## 2024-09-16 ENCOUNTER — Ambulatory Visit
Admission: EM | Admit: 2024-09-16 | Discharge: 2024-09-16 | Disposition: A | Discharge status: Home / Self Care | Attending: Emergency Medicine | Admitting: Emergency Medicine

## 2024-09-16 DIAGNOSIS — M79674 Pain in right toe(s): Secondary | ICD-10-CM

## 2024-09-16 NOTE — ED Triage Notes (Signed)
 Patient complains of right foot pain and right pinky toe pain. Patient states he hit the corner of wall today. Rates pain 10/10. Patient has not taken anything for symptoms

## 2024-09-16 NOTE — ED Provider Notes (Signed)
 CAY RALPH PELT    CSN: [id number] Arrival date & time: 09/16/24  1209      History   Chief Complaint Chief Complaint  Patient presents with   Foot Injury    HPI Jack Sanchez is a 38 y.o. male.   Patient presents for evaluation of right fifth toe pain and swelling beginning this morning after injury.  Endorses that his foot hit the wall.  Painful to bear weight and complete range of motion.  Endorses some numbness.  Has not attempted treatment  History reviewed. No pertinent past medical history.  There are no active problems to display for this patient.   History reviewed. No pertinent surgical history.     Home Medications    Prior to Admission medications   Medication Sig Start Date End Date Taking? Authorizing Provider  cyclobenzaprine  (FLEXERIL ) 10 MG tablet Take 1 tablet (10 mg total) by mouth 3 (three) times daily as needed. 04/15/24   Fisher, Devere ORN, PA-C  ibuprofen  (ADVIL ) 600 MG tablet Take 1 tablet (600 mg total) by mouth every 6 (six) hours as needed. Patient not taking: Reported on 05/09/2024 04/15/24   Gasper Devere ORN, PA-C  pantoprazole  (PROTONIX ) 40 MG tablet Take 1 tablet (40 mg total) by mouth daily for 14 days. 08/04/22 08/18/22  Gordan Huxley, MD  sucralfate  (CARAFATE ) 1 g tablet Take 1 tablet (1 g total) by mouth 4 (four) times daily -  with meals and at bedtime for 14 days. 08/04/22 08/18/22  Gordan Huxley, MD    Family History History reviewed. No pertinent family history.  Social History Social History   Tobacco Use   Smoking status: Every Day   Smokeless tobacco: Never  Vaping Use   Vaping status: Every Day  Substance Use Topics   Alcohol use: Yes   Drug use: Yes    Types: Marijuana     Allergies   Patient has no known allergies.   Review of Systems Review of Systems   Physical Exam Triage Vital Signs ED Triage Vitals  Encounter Vitals Group     BP 09/16/24 1407 (!) 144/76     Girls Systolic BP Percentile --       Girls Diastolic BP Percentile --      Boys Systolic BP Percentile --      Boys Diastolic BP Percentile --      Pulse Rate 09/16/24 1407 88     Resp 09/16/24 1407 20     Temp 09/16/24 1407 97.7 F (36.5 C)     Temp Source 09/16/24 1407 Oral     SpO2 09/16/24 1407 97 %     Weight --      Height --      Head Circumference --      Peak Flow --      Pain Score 09/16/24 1409 10     Pain Loc --      Pain Education --      Exclude from Growth Chart --    No data found.  Updated Vital Signs BP (!) 144/76 (BP Location: Left Arm)   Pulse 88   Temp 97.7 F (36.5 C) (Oral)   Resp 20   SpO2 97%   Visual Acuity Right Eye Distance:   Left Eye Distance:   Bilateral Distance:    Right Eye Near:   Left Eye Near:    Bilateral Near:     Physical Exam Constitutional:      Appearance: Normal appearance.  Eyes:  Extraocular Movements: Extraocular movements intact.  Pulmonary:     Effort: Pulmonary effort is normal.  Musculoskeletal:     Comments: Tenderness and generalized swelling to the right fifth toe without point tenderness, sensation intact, capillary refill less than 3, able to bear weight and complete range of motion but pain is elicited with all movement, 2+ pedal pulse  Neurological:     Mental Status: He is alert and oriented to person, place, and time.      UC Treatments / Results  Labs (all labs ordered are listed, but only abnormal results are displayed) Labs Reviewed - No data to display  EKG   Radiology No results found.  Procedures Procedures (including critical care time)  Medications Ordered in UC Medications - No data to display  Initial Impression / Assessment and Plan / UC Course  I have reviewed the triage vital signs and the nursing notes.  Pertinent labs & imaging results that were available during my care of the patient were reviewed by me and considered in my medical decision making (see chart for details).   Pain in right toe  X-rays  pending, to notify patient via telephone, discussed use of buddy tape if fracture noted as he has not attempted treatment will initiate NSAIDs and RICE for supportive care with podiatry follow-up if symptoms persist or worsen, verbalized understanding of treatment plan Final Clinical Impressions(s) / UC Diagnoses   Final diagnoses:  Pain in right toe(s)     Discharge Instructions      Today you are evaluated for the pain and swelling to your toe  X-ray is pending and you will be notified of results via telephone  Toes have been buddy taped and if there is a break in the bone you will need to leave in place when completing activity, may remove at rest or during hygiene,, this add stability to the toe and the foot  Begin use of ibuprofen  taking 600 800 mg every 6-8 hours and/or Tylenol 325 mg to 500 mg every 6 hours for pain management  May apply ice over the affected area 10 to 15-minute intervals for the next 24 hours then you may use heat if you find it more crafting  Elevate the foot whenever sitting and lying to help reduce swelling  If there is a break in the toe please follow-up with podiatry for further evaluation and management, information on front   ED Prescriptions   None    PDMP not reviewed this encounter.   Teresa Shelba SAUNDERS, NP 09/16/24 [phone]

## 2024-09-16 NOTE — Discharge Instructions (Signed)
 Today you are evaluated for the pain and swelling to your toe  X-ray is pending and you will be notified of results via telephone  Toes have been buddy taped and if there is a break in the bone you will need to leave in place when completing activity, may remove at rest or during hygiene,, this add stability to the toe and the foot  Begin use of ibuprofen  taking 600 800 mg every 6-8 hours and/or Tylenol 325 mg to 500 mg every 6 hours for pain management  May apply ice over the affected area 10 to 15-minute intervals for the next 24 hours then you may use heat if you find it more crafting  Elevate the foot whenever sitting and lying to help reduce swelling  If there is a break in the toe please follow-up with podiatry for further evaluation and management, information on front

## 2024-09-17 ENCOUNTER — Telehealth: Payer: Self-pay | Admitting: Emergency Medicine

## 2024-09-17 NOTE — Telephone Encounter (Signed)
 Attempted to notify patient of x-ray x 1 , no answer, unable to leave voicemail

## 2024-09-21 ENCOUNTER — Ambulatory Visit

## 2024-09-21 DIAGNOSIS — A530 Latent syphilis, unspecified as early or late: Secondary | ICD-10-CM

## 2024-09-21 DIAGNOSIS — Z113 Encounter for screening for infections with a predominantly sexual mode of transmission: Secondary | ICD-10-CM | POA: Diagnosis not present

## 2024-09-21 MED ORDER — DOXYCYCLINE HYCLATE 100 MG PO TABS: 100.0000 mg | ORAL_TABLET | Freq: Two times a day (BID) | ORAL | Status: DC

## 2024-09-21 NOTE — Progress Notes (Signed)
 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 I provided counseling today regarding the medication, the side effects and when to call clinic. Patient was given the opportunity to ask questions for any clarifications. Questions answered. Brochure and condoms given. Wilkie Drought, RN.

## 2024-09-21 NOTE — Progress Notes (Signed)
 [facility] Department STI clinic [address] [place] [address] Main phone: [phone]  STI screening visit  Subjective:  Jack Sanchez is a 38 y.o. male being seen today for an STI screening visit. The patient reports they do not have symptoms.    Patient has the following medical conditions:  There are no active problems to display for this patient.  Chief Complaint  Patient presents with   SEXUALLY TRANSMITTED DISEASE   HPI Patient reports referral after positive syphilis testing at the plasma center. He is very concerned and wants to initiate treatment today. Has not had any symptoms, has not had any known syphilis contacts. To his knowledge, has not been tested for syphilis in the past. ~10 lifetime partners, 1 in the last 2 months.  See flowsheet for further details and programmatic requirements  Hyperlink available at the top of the signed note in blue.  Flow sheet content below:  Pregnancy Intention Screening Does the patient want to become pregnant in the next year?: N/A Does the patient's partner want to become pregnant in the next year?: No Would the patient like to discuss contraceptive options today?: N/A Reason For STD Screen STD Screening: Was referred Have you ever had an STD?: No History of Antibiotic use in the past 2 weeks?: No STD Symptoms Denies all: Yes Counseling Patient counseled to use condoms with all sex: Condoms declined RTC in 2-3 weeks for test results: Yes Clinic will call if test results abnormal before test result appt.: Yes Test results given to patient Patient counseled to use condoms with all sex: Condoms declined  Screening for MPX risk:  Unexplained rash?  No   MSM?  No   Multiple or anonymous sex partners?  No   Any close or sexual contact with a person  diagnosed with MPX?  No   Any outside the [place]  where MPX is endemic?  No   High clinical suspicion for MPX?    -Unlikely to be chickenpox     -Lymphadenopathy    -Rash that presents in same phase of       evolution on any given body part  No   STI screening history: Last HIV test per patient/review of record was No results found for: HMHIVSCREEN No results found for: HIV  Last HEPC test per patient/review of record was No results found for: HMHEPCSCREEN No components found for: HEPC   Last HEPB test per patient/review of record was No components found for: HMHEPBSCREEN   Fertility: Does the patient or their partner desires a pregnancy in the next year? No   There is no immunization history on file for this patient.  The following portions of the patient's history were reviewed and updated as appropriate: allergies, current medications, past medical history, past social history, past surgical history and problem list.  Objective:  There were no vitals filed for this visit.  Physical Exam Constitutional:      Appearance: Normal appearance.  HENT:     Head: Normocephalic.     Mouth/Throat:     Mouth: Mucous membranes are moist.  Eyes:     General: No scleral icterus.       Right eye: No discharge.        Left eye: No discharge.  Pulmonary:     Effort: Pulmonary effort is normal.  Skin:    General: Skin is warm and dry.     Findings: No rash.  Neurological:     General:  No focal deficit present.     Mental Status: He is alert.  Psychiatric:        Mood and Affect: Mood normal.        Behavior: Behavior normal.     Assessment and Plan:  Jack Sanchez is a 38 y.o. male presenting to the [facility] Department for STI screening  1. 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  - Will need to extend to a full 28 days of doxycyline if confirmed syphilis as will be syphilis of unknown duration - Strongly desired initiation of  treatment today despite counseling that this may be a false positive test - He declines all other STI testing today despite counseling  Patient does not have STI symptoms Patient accepted the following screenings: RPR Patient meets criteria for HepB screening? No. Ordered? no Patient meets criteria for HepC screening? No. Ordered? no Recommended condom use with all sex Discussed importance of condom use for STI prevention  Treat positive test results per standing order. Discussed time line for [facility] results and that patient will be called with positive results and encouraged patient to call if he had not heard in 2 weeks Recommended repeat testing in 3 months with positive results. Recommended returning for continued or worsening symptoms.   Follow up with [facility] if syphilis is confirmed, will need doxycycline extended to 28 days.  No future appointments.  Damien FORBES Satchel, NP

## 2024-09-23 LAB — SYPHILIS SEROLOGY, ~~LOC~~ LAB
RPR, Quant: 1:64 {titer}
RPR: REACTIVE
Syphilis Treponemal Ab: REACTIVE

## 2024-10-05 ENCOUNTER — Ambulatory Visit: Payer: Self-pay | Admitting: Family Medicine

## 2024-10-05 DIAGNOSIS — A530 Latent syphilis, unspecified as early or late: Secondary | ICD-10-CM | POA: Insufficient documentation

## 2024-10-05 MED ORDER — DOXYCYCLINE HYCLATE 100 MG PO TABS
100.0000 mg | ORAL_TABLET | Freq: Two times a day (BID) | ORAL | 0 refills | Status: AC
Start: 2024-10-05 — End: ?

## 2024-10-05 NOTE — Progress Notes (Signed)
 Revieed syphilis testing from 09/21/2024. 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 No known history of syphilis. Was already given 14 days of doxycycline on 10/30, will need 14 more days; 28 days total recommended for syphilis of unknown duration.   Forwarding to CD Statistician. Will send script to pharmacy on file.   Dorothyann Helling, MD 10/05/24  4:24 PM

## 2024-10-05 NOTE — Telephone Encounter (Signed)
 Patient needs additional doxycycline treatment for syphilis per Dr. Francesco order.  Called [phone] - message received that customer not accepting calls right now.

## 2024-10-06 ENCOUNTER — Telehealth: Payer: Self-pay

## 2024-10-06 NOTE — Telephone Encounter (Addendum)
 Phone call to pt at [phone]. Received automated message that the person cannot accept calls at this time. Unable to leave a message. Tried twice.  MyChart not active.  (Spoke with Dama Byes, DIS. Hx: DIS tried to locate pt in 2017 and then again in 2019, named as possible contact to [phone]. DIS was unable to reach him.)

## 2024-10-06 NOTE — Telephone Encounter (Signed)
 No additional numbers or pt contacts listed to try to get in touch with pt about TR and needed medication.  Sent message to pt's phone [phone] requesting a call to dr office (NO pt identifiers in message).

## 2024-10-09 NOTE — Telephone Encounter (Signed)
 Phone call to pt at [phone]. Received automated message that the person cannot accept calls at this time. Unable to leave a message. Tried twice.

## 2024-11-28 NOTE — Telephone Encounter (Signed)
 Phone call attempted 10/06/2024 and 10/09/2024 - customer not accepting calls right now.  Letter mailed 10/09/2024.  11/28/2024 - phone call attempt, number no longer in service.
